# Patient Record
Sex: Male | Born: 1995 | Race: White | Hispanic: No | Marital: Single | Smoking: Never smoker
Health system: Southern US, Community
[De-identification: ages and names within clinical notes are randomized; demographics above are authoritative.]

## PROBLEM LIST (undated history)

## (undated) DIAGNOSIS — T7840XA Allergy, unspecified, initial encounter: Secondary | ICD-10-CM

## (undated) DIAGNOSIS — K219 Gastro-esophageal reflux disease without esophagitis: Secondary | ICD-10-CM

## (undated) HISTORY — DX: Gastro-esophageal reflux disease without esophagitis: K21.9

## (undated) HISTORY — PX: WISDOM TOOTH EXTRACTION: SHX21

## (undated) HISTORY — PX: TONSILLECTOMY AND ADENOIDECTOMY: SUR1326

## (undated) HISTORY — DX: Allergy, unspecified, initial encounter: T78.40XA

## (undated) HISTORY — PX: OTHER SURGICAL HISTORY: SHX169

---
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
## 2018-05-21 ENCOUNTER — Ambulatory Visit: Payer: BLUE CROSS/BLUE SHIELD | Admitting: Gastroenterology

## 2018-05-21 ENCOUNTER — Other Ambulatory Visit (INDEPENDENT_AMBULATORY_CARE_PROVIDER_SITE_OTHER): Payer: BLUE CROSS/BLUE SHIELD

## 2018-05-21 ENCOUNTER — Encounter: Payer: Self-pay | Admitting: Gastroenterology

## 2018-05-21 VITALS — BP 96/58 | HR 78 | Ht 70.0 in | Wt 147.1 lb

## 2018-05-21 DIAGNOSIS — K625 Hemorrhage of anus and rectum: Secondary | ICD-10-CM | POA: Diagnosis not present

## 2018-05-21 LAB — CBC WITH DIFFERENTIAL/PLATELET
BASOS ABS: 0 10*3/uL (ref 0.0–0.1)
Basophils Relative: 0.9 % (ref 0.0–3.0)
EOS ABS: 0.3 10*3/uL (ref 0.0–0.7)
Eosinophils Relative: 6.5 % — ABNORMAL HIGH (ref 0.0–5.0)
HEMATOCRIT: 50.6 % (ref 39.0–52.0)
HEMOGLOBIN: 17.1 g/dL — AB (ref 13.0–17.0)
Lymphocytes Relative: 37.9 % (ref 12.0–46.0)
Lymphs Abs: 2 10*3/uL (ref 0.7–4.0)
MCHC: 33.8 g/dL (ref 30.0–36.0)
MCV: 92.5 fl (ref 78.0–100.0)
MONOS PCT: 12.4 % — AB (ref 3.0–12.0)
Monocytes Absolute: 0.7 10*3/uL (ref 0.1–1.0)
Neutro Abs: 2.2 10*3/uL (ref 1.4–7.7)
Neutrophils Relative %: 42.3 % — ABNORMAL LOW (ref 43.0–77.0)
Platelets: 222 10*3/uL (ref 150.0–400.0)
RBC: 5.47 Mil/uL (ref 4.22–5.81)
RDW: 13.4 % (ref 11.5–15.5)
WBC: 5.3 10*3/uL (ref 4.0–10.5)

## 2018-05-21 LAB — COMPREHENSIVE METABOLIC PANEL
ALT: 14 U/L (ref 0–53)
AST: 19 U/L (ref 0–37)
Albumin: 5 g/dL (ref 3.5–5.2)
Alkaline Phosphatase: 89 U/L (ref 39–117)
BILIRUBIN TOTAL: 0.7 mg/dL (ref 0.2–1.2)
BUN: 19 mg/dL (ref 6–23)
CHLORIDE: 101 meq/L (ref 96–112)
CO2: 31 mEq/L (ref 19–32)
CREATININE: 0.99 mg/dL (ref 0.40–1.50)
Calcium: 10.2 mg/dL (ref 8.4–10.5)
GFR: 94.2 mL/min (ref >60.00)
Glucose, Bld: 79 mg/dL (ref 70–99)
POTASSIUM: 4.7 meq/L (ref 3.5–5.1)
SODIUM: 138 meq/L (ref 135–145)
Total Protein: 7.1 g/dL (ref 6.0–8.3)

## 2018-05-21 LAB — TSH: TSH: 1.53 u[IU]/mL (ref 0.35–4.50)

## 2018-05-21 NOTE — Progress Notes (Signed)
    Chief Complaint: Rectal bleeding  Referring Provider: Self-referred      ASSESSMENT AND PLAN;   #1.  Rectal bleeding. D/d hoids, AVMs, colitis, polyps, stercoral ulcers etc, doubt colonic neoplasms or IBD.  Plan: - CBC, CMP and TSH today. - Proceed with colonoscopy for further evaluation.  Have discussed risks and benefits. D/W patient's mother. HPI:    Reginald PagesDaniel Hamilton is a 23 y.o. male  Accompanied by his mother and girlfriend With intermittent rectal bleeding for 1 year Mostly bright red blood and occasionally dark but not black.  At times mixed with the stool. No diarrhea No abdominal or rectal pain Has been on high-fiber diet. No weight loss No fever or chills No skin rash Denies use of nonsteroidals  Mom quite concerned about colonic lesions as her dad had colon cancer.  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   History reviewed. No pertinent past medical history.  Past Surgical History:  Procedure Laterality Date  . OTHER SURGICAL HISTORY     blockage under the stomach. 5314 days old   . TONSILLECTOMY AND ADENOIDECTOMY      Family History  Problem Relation Age of Onset  . Colon cancer Maternal Grandfather   . Esophageal cancer Neg Hx     Social History   Tobacco Use  . Smoking status: Never Smoker  . Smokeless tobacco: Never Used  Substance Use Topics  . Alcohol use: Not Currently  . Drug use: Not Currently    No current outpatient medications on file.   No current facility-administered medications for this visit.     Not on File  Review of Systems:  Constitutional: Denies fever, chills, diaphoresis, appetite change and fatigue.  HEENT: Denies photophobia, eye pain, redness, hearing loss, ear pain, congestion, sore throat, rhinorrhea, sneezing, mouth sores, neck pain, neck stiffness and tinnitus.   Respiratory: Denies SOB, DOE, cough, chest tightness,  and wheezing.   Cardiovascular: Denies  chest pain, palpitations and leg swelling.  Genitourinary: Denies dysuria, urgency, frequency, hematuria, flank pain and difficulty urinating.  Musculoskeletal: Denies myalgias, back pain, joint swelling, arthralgias and gait problem.  Skin: No rash.  Neurological: Denies dizziness, seizures, syncope, weakness, light-headedness, numbness and headaches.  Hematological: Denies adenopathy. Easy bruising, personal or family bleeding history  Psychiatric/Behavioral: No anxiety or depression     Physical Exam:    BP (!) 96/58   Pulse 78   Ht 5\' 10"  (1.778 m)   Wt 147 lb 2 oz (66.7 kg)   BMI 21.11 kg/m  Filed Weights   05/21/18 0909  Weight: 147 lb 2 oz (66.7 kg)   Constitutional:  Well-developed, in no acute distress. Psychiatric: Normal mood and affect. Behavior is normal. HEENT: Pupils normal.  Conjunctivae are normal. No scleral icterus. Neck supple.  Cardiovascular: Normal rate, regular rhythm. No edema Pulmonary/chest: Effort normal and breath sounds normal. No wheezing, rales or rhonchi. Abdominal: Soft, nondistended. Nontender. Bowel sounds active throughout. There are no masses palpable. No hepatomegaly. Rectal: To be performed at the time of colonoscopy. Neurological: Alert and oriented to person place and time. Skin: Skin is warm and dry. No rashes noted.    Edman Circleaj Marieclaire Bettenhausen, MD 05/21/2018, 9:32 AM  Cc: No ref. provider found

## 2018-05-21 NOTE — Patient Instructions (Signed)
If you are age 23 or older, your body mass index should be between 23-30. Your Body mass index is 21.11 kg/m. If this is out of the aforementioned range listed, please consider follow up with your Primary Care Provider.  If you are age 23 or younger, your body mass index should be between 19-25. Your Body mass index is 21.11 kg/m. If this is out of the aformentioned range listed, please consider follow up with your Primary Care Provider.   You have been scheduled for a colonoscopy. Please follow written instructions given to you at your visit today.  Please pick up your prep supplies at the pharmacy within the next 1-3 days. If you use inhalers (even only as needed), please bring them with you on the day of your procedure. Your physician has requested that you go to www.startemmi.com and enter the access code given to you at your visit today. This web site gives a general overview about your procedure. However, you should still follow specific instructions given to you by our office regarding your preparation for the procedure.  Please go to the lab on the 2nd floor [address] before you leave the office today.    Thank you,  Dr. Lynann Bolognaajesh Gupta

## 2018-06-06 ENCOUNTER — Encounter: Payer: Self-pay | Admitting: Gastroenterology

## 2018-06-20 ENCOUNTER — Encounter: Payer: BLUE CROSS/BLUE SHIELD | Admitting: Gastroenterology

## 2018-07-08 ENCOUNTER — Ambulatory Visit: Payer: BLUE CROSS/BLUE SHIELD | Admitting: Medical

## 2018-07-08 ENCOUNTER — Other Ambulatory Visit: Payer: Self-pay

## 2018-07-08 ENCOUNTER — Encounter: Payer: Self-pay | Admitting: Medical

## 2018-07-08 VITALS — BP 100/50 | HR 97 | Temp 98.0°F | Resp 18 | Ht 70.0 in | Wt 143.6 lb

## 2018-07-08 DIAGNOSIS — M2550 Pain in unspecified joint: Secondary | ICD-10-CM | POA: Diagnosis not present

## 2018-07-08 DIAGNOSIS — M79645 Pain in left finger(s): Secondary | ICD-10-CM | POA: Diagnosis not present

## 2018-07-08 LAB — SEDIMENTATION RATE: SED RATE: 1 mm/h (ref 0–15)

## 2018-07-08 LAB — C-REACTIVE PROTEIN

## 2018-07-08 LAB — URIC ACID: URIC ACID, SERUM: 4.9 mg/dL (ref 4.0–7.8)

## 2018-07-08 NOTE — Progress Notes (Signed)
Subjective:    Patient ID: Reginald Hamilton, male    DOB: [birth year], 23 y.o.   MRN: [id number]  HPI  Pt in for follow first time. He states former pt of Dr. Chestine Spore.  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 Short jogging only. Pt states drinking one cup of coffee a day. Pt states eating moderate healthy. He is avoid processed foods/fast foods.  Pt in states June 2019 he was working in yard. He states he thinks he got injury to left second digit joint with thorn. He states went to urgent care did xray which showed no fb and they gave him prednisone. The only joint that hurts him is left 2nd digit mcp joint. The area is a lot less tender and painful now. But he can bend his finger well. Also he reports some dip pain in the same finger.  Pt is about to get colonoscopy. He is getting that done tomorrow. He is having this done to evaluate some recent blood in stool.  Pt declines flu vacccine.  He will get tdap.    Review of Systems  Constitutional: Negative for chills, fatigue and fever.  Respiratory: Negative for chest tightness, shortness of breath and wheezing.   Cardiovascular: Negative for chest pain and palpitations.  Gastrointestinal: Negative for abdominal pain.  Musculoskeletal: Positive for arthralgias. Negative for back pain, myalgias and neck stiffness.       Only of left second digit.  Skin: Negative for rash.  Neurological: Negative for dizziness, speech difficulty, weakness, light-headedness and headaches.  Hematological: Negative for adenopathy. Does not bruise/bleed easily.  Psychiatric/Behavioral: Negative for behavioral problems, confusion, dysphoric mood and suicidal ideas. The patient is not nervous/anxious.     Past Medical History:  Diagnosis Date  . Allergy    spring allergies.     Social History   Socioeconomic History  . Marital status: Single    Spouse name: Not on file  . Number of children: Not on file  . Years of education: Not on file  .  Highest education level: Not on file  Occupational History  . Occupation: Statistician  Social Needs  . Financial resource strain: Not on file  . Food insecurity:    Worry: Not on file    Inability: Not on file  . Transportation needs:    Medical: Not on file    Non-medical: Not on file  Tobacco Use  . Smoking status: Never Smoker  . Smokeless tobacco: Never Used  Substance and Sexual Activity  . Alcohol use: Not Currently  . Drug use: Not Currently  . Sexual activity: Yes  Lifestyle  . Physical activity:    Days per week: Not on file    Minutes per session: Not on file  . Stress: Not on file  Relationships  . Social connections:    Talks on phone: Not on file    Gets together: Not on file    Attends religious service: Not on file    Active member of club or organization: Not on file    Attends meetings of clubs or organizations: Not on file    Relationship status: Not on file  . Intimate partner violence:    Fear of current or ex partner: Not on file    Emotionally abused: Not on file    Physically abused: Not on file    Forced sexual activity: Not on file  Other Topics Concern  . Not on file  Social History Narrative  . Not  on file    Past Surgical History:  Procedure Laterality Date  . OTHER SURGICAL HISTORY  1997   Intestinal bloackage @ 64 days old   . pyloric stenosis      possible surgery for this per pt descrpition.   . TONSILLECTOMY AND ADENOIDECTOMY      Family History  Problem Relation Age of Onset  . Colon cancer Maternal Grandfather   . Esophageal cancer Neg Hx     Not on File  No current outpatient medications on file prior to visit.   No current facility-administered medications on file prior to visit.     BP (!) 100/50 (BP Location: Right Arm, Patient Position: Sitting, Cuff Size: Large)   Pulse 97   Temp 98 F (36.7 C) (Oral)   Resp 18   Ht 5\' 10"  (1.778 m)   Wt 143 lb 9.6 oz (65.1 kg)   SpO2 97%   BMI 20.60 kg/m         Objective:   Physical Exam  General Mental Status- Alert. General Appearance- Not in acute distress.    Chest and Lung Exam Auscultation: Breath Sounds:-Normal.  Cardiovascular Auscultation:Rythm- Regular. Murmurs & Other Heart Sounds:Auscultation of the heart reveals- No Murmurs.    Neurologic Cranial Nerve exam:- CN III-XII intact(No nystagmus), symmetric smile. Strength:- 5/5 equal and symmetric strength both upper and lower extremities.  Left second digit- mild swelling and redness to finger joint mcp. But good range of motion. No tednereness on palpation. No fluctuant or induration. No pain on palpation dip. Left hand- overall no other abnormality.      Assessment & Plan:  You do have mild swelling of the finger in middle finger joint with occasional pain in the distal joint.  Since his symptoms have been present since his trauma and x-ray done at urgent care showed no foreign body, I do think is a good idea to get arthritis panel type studies.  Particularly in light of the fact that they gave you prednisone and the swelling seemed to respond.  We will follow the lab results and asked that you sign a release form so we can get a copy of the x-ray report done at the urgent care.  I did decide to go ahead and refer you to sports medicine so they can evaluate this area as well.  Tdap given today.  Follow-up date to be determined after sports medicine evaluation.  They will send me your office note for me to review.  Esperanza Richters, PA-C

## 2018-07-08 NOTE — Patient Instructions (Signed)
  You do have mild swelling of the finger in middle finger joint with occasional pain in the distal joint.  Since his symptoms have been present since his trauma and x-ray done at urgent care showed no foreign body, I do think is a good idea to get arthritis panel type studies.  Particularly in light of the fact that they gave you prednisone and the swelling seemed to respond.  We will follow the lab results and asked that you sign a release form so we can get a copy of the x-ray report done at the urgent care.  I did decide to go ahead and refer you to sports medicine so they can evaluate this area as well.  Tdap given today.  Follow-up date to be determined after sports medicine evaluation.  They will send me your office note for me to review.

## 2018-07-09 ENCOUNTER — Other Ambulatory Visit: Payer: Self-pay

## 2018-07-09 ENCOUNTER — Encounter: Payer: Self-pay | Admitting: Gastroenterology

## 2018-07-09 ENCOUNTER — Ambulatory Visit (AMBULATORY_SURGERY_CENTER): Payer: BLUE CROSS/BLUE SHIELD | Admitting: Gastroenterology

## 2018-07-09 VITALS — BP 89/54 | HR 50 | Temp 99.3°F | Resp 15 | Ht 70.0 in | Wt 147.0 lb

## 2018-07-09 DIAGNOSIS — K625 Hemorrhage of anus and rectum: Secondary | ICD-10-CM

## 2018-07-09 DIAGNOSIS — Z800 Family history of malignant neoplasm of digestive organs: Secondary | ICD-10-CM | POA: Diagnosis not present

## 2018-07-09 MED ORDER — SODIUM CHLORIDE 0.9 % IV SOLN: 500.0000 mL | Freq: Once | INTRAVENOUS | Status: DC

## 2018-07-09 NOTE — Op Note (Signed)
[facility] Patient Name: Reginald Hamilton Procedure Date: 07/09/2018 2:21 PM MRN: [id number] Endoscopist: Lynann Bologna , MD Age: 23 Referring MD:  Date of Birth: 1995/06/16 Gender: Male Account #: [id number] Procedure:                Colonoscopy Indications:              Rectal bleeding, Family history of colon cancer                            (second degree relative). Medicines:                Monitored Anesthesia Care Procedure:                Pre-Anesthesia Assessment:                           - Prior to the procedure, a History and Physical                            was performed, and patient medications and                            allergies were reviewed. The patient's tolerance of                            previous anesthesia was also reviewed. The risks                            and benefits of the procedure and the sedation                            options and risks were discussed with the patient.                            All questions were answered, and informed consent                            was obtained. Prior Anticoagulants: The patient has                            taken no previous anticoagulant or antiplatelet                            agents. ASA Grade Assessment: I - A normal, healthy                            patient. After reviewing the risks and benefits,                            the patient was deemed in satisfactory condition to                            undergo the procedure.  After obtaining informed consent, the colonoscope                            was passed under direct vision. Throughout the                            procedure, the patient's blood pressure, pulse, and                            oxygen saturations were monitored continuously. The                            Colonoscope was introduced through the anus and                            advanced to the 2 cm into the ileum. The                 colonoscopy was performed with ease. The patient                            tolerated the procedure well. The quality of the                            bowel preparation was good. The terminal ileum,                            ileocecal valve, appendiceal orifice, and rectum                            were photographed. Scope In: 2:24:29 PM Scope Out: 2:37:47 PM Scope Withdrawal Time: 0 hours 10 minutes 29 seconds  Total Procedure Duration: 0 hours 13 minutes 18 seconds  Findings:                 The colon (entire examined portion) appeared normal.                           The terminal ileum appeared normal.                           The exam was otherwise without abnormality on                            direct and retroflexion views. Complications:            No immediate complications. Estimated Blood Loss:     Estimated blood loss: none. Impression:               - Normal colonoscopy to TI.                           - No specimens collected. Recommendation:           - Patient has a contact number available for  emergencies. The signs and symptoms of potential                            delayed complications were discussed with the                            patient. Return to normal activities tomorrow.                            Written discharge instructions were provided to the                            patient.                           - High fiber diet.                           - Preparation H ointment: Apply externally as                            necessary.                           - Repeat colonoscopy at age 57 for screening                            purposes. Earlier, if any new problems or change in                            family history. Lynann Bologna, MD 07/09/2018 2:45:36 PM This report has been signed electronically.

## 2018-07-09 NOTE — Patient Instructions (Signed)
YOU HAD AN ENDOSCOPIC PROCEDURE TODAY AT THE [facility]:   Refer to the procedure report that was given to you for any specific questions about what was found during the examination.  If the procedure report does not answer your questions, please call your gastroenterologist to clarify.  If you requested that your care partner not be given the details of your procedure findings, then the procedure report has been included in a sealed envelope for you to review at your convenience later.  YOU SHOULD EXPECT: Some feelings of bloating in the abdomen. Passage of more gas than usual.  Walking can help get rid of the air that was put into your GI tract during the procedure and reduce the bloating. If you had a lower endoscopy (such as a colonoscopy or flexible sigmoidoscopy) you may notice spotting of blood in your stool or on the toilet paper. If you underwent a bowel prep for your procedure, you may not have a normal bowel movement for a few days.  Please Note:  You might notice some irritation and congestion in your nose or some drainage.  This is from the oxygen used during your procedure.  There is no need for concern and it should clear up in a day or so.  SYMPTOMS TO REPORT IMMEDIATELY:   Following lower endoscopy (colonoscopy or flexible sigmoidoscopy):  Excessive amounts of blood in the stool  Significant tenderness or worsening of abdominal pains  Swelling of the abdomen that is new, acute  Fever of 100F or higher  For urgent or emergent issues, a gastroenterologist can be reached at any hour by calling [phone].   DIET:  We do recommend a small meal at first, but then you may proceed to your regular diet.  Drink plenty of fluids but you should avoid alcoholic beverages for 24 hours.  ACTIVITY:  You should plan to take it easy for the rest of today and you should NOT DRIVE or use heavy machinery until tomorrow (because of the sedation medicines used during the test).     FOLLOW UP: Our staff will call the number listed on your records the next business day following your procedure to check on you and address any questions or concerns that you may have regarding the information given to you following your procedure. If we do not reach you, we will leave a message.  However, if you are feeling well and you are not experiencing any problems, there is no need to return our call.  We will assume that you have returned to your regular daily activities without incident.  If any biopsies were taken you will be contacted by phone or by letter within the next 1-3 weeks.  Please call us at [phone] if you have not heard about the biopsies in 3 weeks.    SIGNATURES/CONFIDENTIALITY: You and/or your care partner have signed paperwork which will be entered into your electronic medical record.  These signatures attest to the fact that that the information above on your After Visit Summary has been reviewed and is understood.  Full responsibility of the confidentiality of this discharge information lies with you and/or your care-partner. 

## 2018-07-09 NOTE — Progress Notes (Signed)
Report given to PACU, vss 

## 2018-07-10 ENCOUNTER — Telehealth: Payer: Self-pay | Admitting: *Deleted

## 2018-07-10 LAB — RHEUMATOID FACTOR: Rhuematoid fact SerPl-aCnc: <14 IU/mL (ref <14)

## 2018-07-10 LAB — ANA: ANA: NEGATIVE

## 2018-07-10 NOTE — Telephone Encounter (Signed)
  Follow up Call-  Call back number 07/09/2018  Post procedure Call Back phone  # #[phone] cell  Permission to leave phone message Yes  Some recent data might be hidden     Patient questions:  Do you have a fever, pain , or abdominal swelling? No. Pain Score  0 *  Have you tolerated food without any problems? Yes.    Have you been able to return to your normal activities? Yes  Do you have any questions about your discharge instructions: Diet   No. Medications  No. Follow up visit  No.  Do you have questions or concerns about your Care? No.  Actions: * If pain score is 4 or above: No action needed, pain <4.

## 2018-07-14 ENCOUNTER — Other Ambulatory Visit: Payer: Self-pay

## 2018-07-14 ENCOUNTER — Ambulatory Visit: Payer: Self-pay

## 2018-07-14 ENCOUNTER — Ambulatory Visit: Payer: BLUE CROSS/BLUE SHIELD | Admitting: Family Medicine

## 2018-07-14 ENCOUNTER — Encounter: Payer: Self-pay | Admitting: Family Medicine

## 2018-07-14 VITALS — BP 126/61 | HR 65 | Ht 70.0 in | Wt 145.0 lb

## 2018-07-14 DIAGNOSIS — M79645 Pain in left finger(s): Secondary | ICD-10-CM | POA: Diagnosis not present

## 2018-07-14 NOTE — Progress Notes (Signed)
PCP and consultation requested by: Esperanza Richters, PA-C  Subjective:   HPI: Patient is a 23 y.o. male here for left finger swelling.  Patient reports pain and swelling in left 2nd digit started last June when he was clearing some brush for his girlfriend at the time. He recalls getting some brush with thorns sticking him in this finger. Most of symptoms have resolved though still has swollen area over PIP joint dorsally. At the time he had x-rays that were negative and took prednisone with mild improvement. Only bothers when hits this on something and can be 2/10 level. No pain currently. Has full strength in this hand, doesn't limit him. No skin changes, numbness.  Past Medical History:  Diagnosis Date  . Allergy    spring allergies.  Marland Kitchen GERD (gastroesophageal reflux disease)     Current Outpatient Medications on File Prior to Visit  Medication Sig Dispense Refill  . Magnesium 250 MG TABS Take 250 mg by mouth daily.    . Multiple Vitamins-Minerals (MENS MULTIVITAMIN PO) Take by mouth.    . Turmeric 500 MG TABS Take 500 mg by mouth daily.     No current facility-administered medications on file prior to visit.     Past Surgical History:  Procedure Laterality Date  . OTHER SURGICAL HISTORY  1997   Intestinal bloackage @ 83 days old   . pyloric stenosis      possible surgery for this per pt descrpition.   . TONSILLECTOMY AND ADENOIDECTOMY    . WISDOM TOOTH EXTRACTION      Allergies  Allergen Reactions  . Pollen Extract     Pollen - sneezing, itchy eyes    Social History   Socioeconomic History  . Marital status: Single    Spouse name: Not on file  . Number of children: Not on file  . Years of education: Not on file  . Highest education level: Not on file  Occupational History  . Occupation: Statistician  Social Needs  . Financial resource strain: Not on file  . Food insecurity:    Worry: Not on file    Inability: Not on file  . Transportation needs:   Medical: Not on file    Non-medical: Not on file  Tobacco Use  . Smoking status: Never Smoker  . Smokeless tobacco: Never Used  Substance and Sexual Activity  . Alcohol use: Not Currently  . Drug use: Not Currently  . Sexual activity: Yes  Lifestyle  . Physical activity:    Days per week: Not on file    Minutes per session: Not on file  . Stress: Not on file  Relationships  . Social connections:    Talks on phone: Not on file    Gets together: Not on file    Attends religious service: Not on file    Active member of club or organization: Not on file    Attends meetings of clubs or organizations: Not on file    Relationship status: Not on file  . Intimate partner violence:    Fear of current or ex partner: Not on file    Emotionally abused: Not on file    Physically abused: Not on file    Forced sexual activity: Not on file  Other Topics Concern  . Not on file  Social History Narrative  . Not on file    Family History  Problem Relation Age of Onset  . Colon cancer Maternal Grandfather   . Esophageal cancer Neg Hx   .  Rectal cancer Neg Hx   . Stomach cancer Neg Hx     BP 126/61   Pulse 65   Ht 5\' 10"  (1.778 m)   Wt 145 lb (65.8 kg)   BMI 20.81 kg/m   Review of Systems: See HPI above.     Objective:  Physical Exam:  Gen: NAD, comfortable in exam room  Left hand: Localized swelling dorsal 2nd PIP, mobile, superficial.  No malrotation, angulation, other deformity. FROM with 5/5 strength all digits including MCP, PIP, DIP joints. Collateral ligaments intact. No tenderness to palpation. NVI distally.  Right hand: No deformity. FROM with 5/5 strength. No tenderness to palpation. NVI distally.  MSK u/s: small foreign body superficial to extensor tendon with small surrounding anechoic region.  Measures 1.43mm in largest dimension, linear.  Extensor tendon intact.  No joint or bony abnormalities.  Assessment & Plan:  1. Left 2nd digit pain, swelling - small  foreign body visualized presumed to be thorn.  No impact on joint, bone, or tendon.  Functionally  No limitations from this - I encouraged no additional treatment though he would like to consult with hand surgery to discuss removal - will refer to them for further discussion.  Icing, ibuprofen only if needed.  F/u prn otherwise.

## 2018-07-14 NOTE — Patient Instructions (Signed)
You have a small retained foreign body with cyst around this. We will refer you to a hand surgeon to discuss what surgery would entail, if they can do this in the office if it's something you want to pursue. Activities as tolerated without restrictions. Icing, ibuprofen if needed. Follow up with me as needed otherwise.

## 2018-07-17 ENCOUNTER — Ambulatory Visit: Payer: BLUE CROSS/BLUE SHIELD

## 2019-08-10 ENCOUNTER — Encounter: Payer: Self-pay | Admitting: Medical

## 2019-08-10 ENCOUNTER — Other Ambulatory Visit: Payer: Self-pay

## 2019-08-10 ENCOUNTER — Telehealth (INDEPENDENT_AMBULATORY_CARE_PROVIDER_SITE_OTHER): Payer: BC Managed Care – PPO | Admitting: Medical

## 2019-08-10 DIAGNOSIS — U071 COVID-19: Secondary | ICD-10-CM

## 2019-08-10 NOTE — Patient Instructions (Signed)
Patient has recent Covid infection with mild type symptoms with quick resolution of signs or symptoms.  Decided to go ahead and put him back to work on April 17 which will be 2 weeks past is positive test.  He has any recurrent signs/symptoms during the interim advised him to let me know.  If any problems or return work on him as well.  Follow-up as needed.

## 2019-08-10 NOTE — Progress Notes (Signed)
Subjective:    Patient ID: Reginald Hamilton, male    DOB: 04-26-1996, 24 y.o.   MRN: [id number]  HPI  Pt states August 01, 2019 he had some covid symptoms with mild fatigue, mild swollen lymph nodes and fever for 2 hours. He got tested at [facility] and 2 days later tested + which was August 04, 2019. Pt works Engineering geologist at [employer].  He states since 8 th he felt like symptoms resolved. Started to feel back to normal on April 7,2021.  Pt stats sometime last year he did get tdap.  Pt had no bp cuff or pulse monitor. Did not check his temp during care gility visit.   Review of Systems  Constitutional: Negative for chills, fatigue and fever.  Respiratory: Negative for cough, chest tightness and wheezing.   Cardiovascular: Negative for chest pain and palpitations.  Gastrointestinal: Negative for abdominal pain, diarrhea, rectal pain and vomiting.  Musculoskeletal: Negative for back pain and myalgias.  Skin: Negative for rash.  Neurological: Negative for dizziness, syncope, weakness, numbness and headaches.  Hematological: Negative for adenopathy. Does not bruise/bleed easily.  Psychiatric/Behavioral: Negative for behavioral problems and sleep disturbance. The patient is not nervous/anxious.    Past Medical History:  Diagnosis Date  . Allergy    spring allergies.  Marland Kitchen GERD (gastroesophageal reflux disease)      Social History   Socioeconomic History  . Marital status: Single    Spouse name: Not on file  . Number of children: Not on file  . Years of education: Not on file  . Highest education level: Not on file  Occupational History  . Occupation: [employer]  Tobacco Use  . Smoking status: Never Smoker  . Smokeless tobacco: Never Used  Substance and Sexual Activity  . Alcohol use: Not Currently  . Drug use: Not Currently  . Sexual activity: Yes  Other Topics Concern  . Not on file  Social History Narrative  . Not on file   Social Determinants of Health   Financial Resource Strain:   .  Difficulty of Paying Living Expenses:   Food Insecurity:   . Worried About Programme researcher, broadcasting/film/video in the Last Year:   . Barista in the Last Year:   Transportation Needs:   . Freight forwarder (Medical):   Marland Kitchen Lack of Transportation (Non-Medical):   Physical Activity:   . Days of Exercise per Week:   . Minutes of Exercise per Session:   Stress:   . Feeling of Stress :   Social Connections:   . Frequency of Communication with Friends and Family:   . Frequency of Social Gatherings with Friends and Family:   . Attends Religious Services:   . Active Member of Clubs or Organizations:   . Attends Banker Meetings:   Marland Kitchen Marital Status:   Intimate Partner Violence:   . Fear of Current or Ex-Partner:   . Emotionally Abused:   Marland Kitchen Physically Abused:   . Sexually Abused:     Past Surgical History:  Procedure Laterality Date  . OTHER SURGICAL HISTORY  1997   Intestinal bloackage @ 84 days old   . pyloric stenosis      possible surgery for this per pt descrpition.   . TONSILLECTOMY AND ADENOIDECTOMY    . WISDOM TOOTH EXTRACTION      Family History  Problem Relation Age of Onset  . Colon cancer Maternal Grandfather   . Esophageal cancer Neg Hx   . Rectal cancer  Neg Hx   . Stomach cancer Neg Hx     Allergies  Allergen Reactions  . Pollen Extract     Pollen - sneezing, itchy eyes    Current Outpatient Medications on File Prior to Visit  Medication Sig Dispense Refill  . Magnesium 250 MG TABS Take 250 mg by mouth daily.    . Multiple Vitamins-Minerals (MENS MULTIVITAMIN PO) Take by mouth.    . Turmeric 500 MG TABS Take 500 mg by mouth daily.     No current facility-administered medications on file prior to visit.    There were no vitals taken for this visit.      Objective:   Physical Exam  General-no acute distress, pleasant, oriented. Lungs- on inspection lungs appear unlabored. Neck- no tracheal deviation or jvd on inspection. Neuro- gross motor  function appears intact.      Assessment & Plan:  Patient has recent Covid infection with mild type symptoms with quick resolution of signs or symptoms.  Decided to go ahead and put him back to work on April 17 which will be 2 weeks past is positive test.  He has any recurrent signs/symptoms during the interim advised him to let me know.  If any problems or return work on him as well.  Follow-up as needed.  Mackie Pai, PA-C   Time spent with patient today was 20  minutes discussing diagnosis, work up treatment and documentation.

## 2019-08-14 ENCOUNTER — Telehealth: Payer: BLUE CROSS/BLUE SHIELD | Admitting: Medical

## 2019-12-15 ENCOUNTER — Telehealth: Payer: Self-pay

## 2019-12-15 NOTE — Telephone Encounter (Signed)
Caller requesting return call to get hep C shot. Telephone: [phone]

## 2019-12-15 NOTE — Telephone Encounter (Signed)
What prompted this request. Not aware of hep c vaccine. This is what he asking for? Hep C vaccine not developed yet that I am aware of. He can make appointment to discuss. I have seen him one time.  Maybe offer visit.

## 2019-12-16 NOTE — Telephone Encounter (Signed)
He can get scheduled for nurse vaccine visit.

## 2019-12-16 NOTE — Telephone Encounter (Signed)
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

## 2019-12-16 NOTE — Telephone Encounter (Signed)
NV scheduled 8/25

## 2019-12-23 ENCOUNTER — Ambulatory Visit: Payer: BC Managed Care – PPO
